# Patient Record
Sex: Female | Born: 1955 | Race: White | Hispanic: No | Marital: Single | Smoking: Former smoker
Health system: Southern US, Community
[De-identification: ages and names within clinical notes are randomized; demographics above are authoritative.]

---

## 1998-04-16 ENCOUNTER — Other Ambulatory Visit: Admission: RE | Admit: 1998-04-16 | Discharge: 1998-04-16 | Payer: Self-pay | Admitting: *Deleted

## 1998-05-12 ENCOUNTER — Ambulatory Visit (HOSPITAL_COMMUNITY): Admission: RE | Admit: 1998-05-12 | Discharge: 1998-05-12 | Payer: Self-pay | Admitting: *Deleted

## 1998-05-12 ENCOUNTER — Other Ambulatory Visit: Admission: RE | Admit: 1998-05-12 | Discharge: 1998-05-12 | Payer: Self-pay | Admitting: *Deleted

## 1999-07-12 ENCOUNTER — Ambulatory Visit (HOSPITAL_COMMUNITY): Admission: RE | Admit: 1999-07-12 | Discharge: 1999-07-12 | Payer: Self-pay | Admitting: *Deleted

## 1999-07-12 ENCOUNTER — Encounter: Payer: Self-pay | Admitting: *Deleted

## 1999-07-20 ENCOUNTER — Other Ambulatory Visit: Admission: RE | Admit: 1999-07-20 | Discharge: 1999-07-20 | Payer: Self-pay | Admitting: *Deleted

## 1999-08-19 ENCOUNTER — Encounter: Payer: Self-pay | Admitting: Obstetrics and Gynecology

## 1999-08-23 ENCOUNTER — Inpatient Hospital Stay (HOSPITAL_COMMUNITY): Admission: RE | Admit: 1999-08-23 | Discharge: 1999-08-25 | Payer: Self-pay | Admitting: Obstetrics and Gynecology

## 1999-08-31 ENCOUNTER — Ambulatory Visit (HOSPITAL_COMMUNITY): Admission: RE | Admit: 1999-08-31 | Discharge: 1999-08-31 | Payer: Self-pay | Admitting: Obstetrics and Gynecology

## 1999-08-31 ENCOUNTER — Encounter: Payer: Self-pay | Admitting: Obstetrics and Gynecology

## 2000-09-28 ENCOUNTER — Encounter: Payer: Self-pay | Admitting: *Deleted

## 2000-09-28 ENCOUNTER — Ambulatory Visit (HOSPITAL_COMMUNITY): Admission: RE | Admit: 2000-09-28 | Discharge: 2000-09-28 | Payer: Self-pay | Admitting: *Deleted

## 2003-06-08 ENCOUNTER — Encounter: Payer: Self-pay | Admitting: *Deleted

## 2003-06-08 ENCOUNTER — Ambulatory Visit (HOSPITAL_COMMUNITY): Admission: RE | Admit: 2003-06-08 | Discharge: 2003-06-08 | Payer: Self-pay | Admitting: *Deleted

## 2005-09-18 ENCOUNTER — Ambulatory Visit (HOSPITAL_COMMUNITY): Admission: RE | Admit: 2005-09-18 | Discharge: 2005-09-18 | Payer: Self-pay | Admitting: Neurosurgery

## 2005-10-19 ENCOUNTER — Observation Stay (HOSPITAL_COMMUNITY): Admission: RE | Admit: 2005-10-19 | Discharge: 2005-10-19 | Payer: Self-pay | Admitting: Neurosurgery

## 2017-06-04 ENCOUNTER — Other Ambulatory Visit: Payer: Self-pay

## 2017-06-04 ENCOUNTER — Encounter: Payer: Self-pay | Admitting: *Deleted

## 2017-06-04 ENCOUNTER — Emergency Department (INDEPENDENT_AMBULATORY_CARE_PROVIDER_SITE_OTHER)
Admission: EM | Admit: 2017-06-04 | Discharge: 2017-06-04 | Disposition: A | Discharge status: Home / Self Care | Payer: BLUE CROSS/BLUE SHIELD | Source: Home / Self Care | Attending: Family Medicine | Admitting: Family Medicine

## 2017-06-04 DIAGNOSIS — T50901A Poisoning by unspecified drugs, medicaments and biological substances, accidental (unintentional), initial encounter: Secondary | ICD-10-CM | POA: Diagnosis not present

## 2017-06-04 DIAGNOSIS — R0989 Other specified symptoms and signs involving the circulatory and respiratory systems: Secondary | ICD-10-CM

## 2017-06-04 NOTE — ED Triage Notes (Signed)
Pt reports that she accidentally injected herself with Evzio(Naxolone) at 10:15am today. She reports feeling shaky, hot flashes, and chills.

## 2017-06-04 NOTE — Discharge Instructions (Addendum)
Remain well hydrated. If symptoms become significantly worse during the night or over the weekend, proceed to the local emergency room.

## 2017-06-04 NOTE — ED Notes (Signed)
Gave pt two 16oz glasses of water. Clemens Catholichristy Laylani Pudwill, LPN

## 2017-06-04 NOTE — ED Provider Notes (Signed)
[facility]    CSN: [id number] Arrival date & time: 06/04/17  1034     History   Chief Complaint Chief Complaint  Patient presents with  . Drug Overdose    HPI Peggy Robinson is a 61 y.o. female.   Patient reports that she was demonstrating use of a Naloxone injector about one hour ago, when she accidentally injected herself with an actual 2mg  dose of Naloxone when she thought that she was using a demonstration injector.  She states that she initially felt flushed with sweats/chlls and mild nausea (no vomiting).  She denies chest pain or shortness of breath.  No neurologic symptoms.  She reports that she has a history of labile hypertension.  She reports that she now feels well.   The history is provided by the patient.    History reviewed. No pertinent past medical history.  There are no active problems to display for this patient.   History reviewed. No pertinent surgical history.  OB History    No data available       Home Medications    Prior to Admission medications   Not on File    Family History Family History  Problem Relation Age of Onset  . Heart disease Father     Social History Social History  Substance Use Topics  . Smoking status: Former Smoker    Quit date: 02/18/2009  . Smokeless tobacco: Never Used  . Alcohol use No     Allergies   Patient has no known allergies.   Review of Systems Review of Systems  Constitutional: Positive for chills and diaphoresis. Negative for fatigue and fever.  HENT: Negative.   Eyes: Negative.   Respiratory: Negative.   Cardiovascular: Negative.   Gastrointestinal: Positive for nausea. Negative for vomiting.  Genitourinary: Negative.   Musculoskeletal: Negative.   Skin: Negative.   Neurological: Negative for dizziness, speech difficulty, weakness, light-headedness and headaches.     Physical Exam Triage Vital Signs ED Triage Vitals  Enc Vitals Group     BP      Pulse      Resp      Temp      Temp src      SpO2      Weight      Height      Head Circumference      Peak Flow      Pain Score      Pain Loc      Pain Edu?      Excl. in GC?    No data found.   Updated Vital Signs BP (!) 188/103 (BP Location: Left Arm)   Pulse 78   Temp (!) 97.3 F (36.3 C) (Oral)   Resp 18   Ht 5\' 8"  (1.727 m)   Wt 160 lb (72.6 kg)   SpO2 100%   BMI 24.33 kg/m   Visual Acuity Right Eye Distance:   Left Eye Distance:   Bilateral Distance:    Right Eye Near:   Left Eye Near:    Bilateral Near:     Physical Exam Nursing notes and Vital Signs reviewed. Appearance:  Patient appears stated age, and in no acute distress.  She is alert and oriented.  Eyes:  Pupils are equal, round, and reactive to light and accomodation.  Extraocular movement is intact.  Conjunctivae are not inflamed  Ears:  Normal externally. Nose:  Normal.  Pharynx:  Normal Neck:  Supple.  No adenopathy. Lungs:  Clear to auscultation.  Breath sounds are equal.  Moving air well. Heart:  Regular rate and rhythm without murmurs, rubs, or gallops.  Abdomen:  Nontender without masses or hepatosplenomegaly.  Bowel sounds are present.  No CVA or flank tenderness.  Extremities:  No edema.  Skin:  No rash present.   Neurologic:  Cranial nerves 2 through 12 are normal.  Patellar, achilles, and elbow reflexes are normal.  Cerebellar function is intact (finger-to-nose and rapid alternating hand movement).  Gait and station are normal.   UC Treatments / Results  Labs (all labs ordered are listed, but only abnormal results are displayed) Labs Reviewed - No data to display  EKG  EKG Interpretation  Rate:  79 BPM PR:  156 msec QT:  374 msec QTcH:  428 msec QRSD:  70 msec QRS axis:  48 degrees Interpretation:   Normal sinus rhythm.  Possible left atrial enlargement.  Non specific ST abnormality  [facility]:  No significant change.       Radiology No  results found.  Procedures Procedures (including critical care time)  Medications Ordered in UC Medications - No data to display   Initial Impression / Assessment and Plan / UC Course  I have reviewed the triage vital signs and the nursing notes.  Pertinent labs & imaging results that were available during my care of the patient were reviewed by me and considered in my medical decision making (see chart for details).    No evidence of adverse drug reaction at this time.  Patient reports that she feels well at this time.  Note half life of naloxone is approximately 30 to 80 minutes. Remain well hydrated.  Monitor BP at home. Recommend follow-up with PCP tomorrow for re-check of BP. If symptoms become significantly worse during the night or over the weekend, proceed to the local emergency room.      Final Clinical Impressions(s) / UC Diagnoses   Final diagnoses:  Accidental drug ingestion, initial encounter  Labile blood pressure    New Prescriptions New Prescriptions   No medications on file        Lattie HawBeese, Ashling Roane A, MD 06/04/17 1530
